# Patient Record
Sex: Female | Born: 1960 | Race: White | Hispanic: No | Marital: Married | Smoking: Never smoker
Health system: Southern US, Community
[De-identification: ages and names within clinical notes are randomized; demographics above are authoritative.]

---

## 2013-02-18 DIAGNOSIS — Z0000 Encounter for general adult medical examination without abnormal findings: Secondary | ICD-10-CM | POA: Insufficient documentation

## 2019-01-21 DIAGNOSIS — M8580 Other specified disorders of bone density and structure, unspecified site: Secondary | ICD-10-CM | POA: Insufficient documentation

## 2019-11-10 DIAGNOSIS — U071 COVID-19: Secondary | ICD-10-CM | POA: Insufficient documentation

## 2019-12-03 ENCOUNTER — Emergency Department
Admission: EM | Admit: 2019-12-03 | Discharge: 2019-12-03 | Disposition: A | Discharge status: Home / Self Care | Payer: BC Managed Care – PPO | Source: Home / Self Care

## 2019-12-03 ENCOUNTER — Other Ambulatory Visit: Payer: Self-pay

## 2019-12-03 ENCOUNTER — Emergency Department (INDEPENDENT_AMBULATORY_CARE_PROVIDER_SITE_OTHER): Payer: BC Managed Care – PPO

## 2019-12-03 DIAGNOSIS — R053 Chronic cough: Secondary | ICD-10-CM

## 2019-12-03 DIAGNOSIS — J040 Acute laryngitis: Secondary | ICD-10-CM

## 2019-12-03 DIAGNOSIS — Z8616 Personal history of COVID-19: Secondary | ICD-10-CM

## 2019-12-03 DIAGNOSIS — J069 Acute upper respiratory infection, unspecified: Secondary | ICD-10-CM

## 2019-12-03 MED ORDER — GUAIFENESIN ER 600 MG PO TB12: 600.0000 mg | ORAL_TABLET | Freq: Two times a day (BID) | ORAL | 0 refills | Status: DC | PRN

## 2019-12-03 MED ORDER — CEPACOL EXTRA STRENGTH 15-2.6 MG MT LOZG: 1.0000 | LOZENGE | Freq: Four times a day (QID) | OROMUCOSAL | 0 refills | Status: AC | PRN

## 2019-12-03 NOTE — ED Triage Notes (Signed)
Pt saw PCP on 9/15 for bronchitis also had covid test sent out that same day. Pt was given tessalon, prednisone, and albuterol inhaler. PCP called her 3 days later d/t + COVID test. Pt since then has been having cough and sore throat symptoms. Of note, she is not vaccinated. No otc at this time.

## 2019-12-03 NOTE — Discharge Instructions (Signed)
°  Try the medications prescribed to help with your symptoms. Be sure to stay well hydrated and get at least 8 hours of sleep at night, more when feeling ill.  Follow up with family medicine next week if not improving, especially if symptoms worsening.

## 2019-12-03 NOTE — ED Provider Notes (Signed)
[facility]    CSN: [id number] Arrival date & time: 12/03/19  1003      History   Chief Complaint Chief Complaint  Patient presents with  . Cough  . Nasal Congestion  . Sore Throat    HPI Dana Mccarthy is a 59 y.o. female.   HPI Dana Mccarthy is a 59 y.o. female presenting to UC with c/o continued cough since being dx with COVID last month.  She was started on tessalon, prednisone and given an albuterol inhaler with moderate relief.  She has continued to cough and last night developed a sore throat and hoarse voice.  Denies chest pain or SOB.  Low-grade fever 99.3*F.     History reviewed. No pertinent past medical history.  There are no problems to display for this patient.   History reviewed. No pertinent surgical history.  OB History   No obstetric history on file.      Home Medications    Prior to Admission medications   Medication Sig Start Date End Date Taking? Authorizing Provider  albuterol (VENTOLIN HFA) 108 (90 Base) MCG/ACT inhaler Inhale into the lungs. 11/04/19  Yes [provider]  famotidine (PEPCID) 20 MG tablet Take by mouth. 09/21/19  Yes [provider]  ascorbic acid (VITAMIN C) 100 MG tablet Take by mouth.    [provider]  Benzocaine-Menthol (CEPACOL EXTRA STRENGTH) 15-2.6 MG LOZG Use as directed 1 lozenge in the mouth or throat 4 (four) times daily as needed. 12/03/19   Lurene Shadow, PA-C  cetirizine (ZYRTEC) 10 MG tablet Take by mouth.    [provider]  cyanocobalamin 100 MCG tablet Vitamin B-12    [provider]  guaiFENesin (MUCINEX) 600 MG 12 hr tablet Take 1 tablet (600 mg total) by mouth 2 (two) times daily as needed for cough or to loosen phlegm. Take with large glass of water 12/03/19   Lurene Shadow, PA-C  Omega-3 Fatty Acids (FISH OIL) 1000 MG CAPS Take 1 capsule by mouth daily.    [provider]  zinc gluconate 50 MG tablet Take by mouth.    [provider]    Family History Family History  Problem Relation Age of Onset  . Cancer Mother     Social History Social History   Tobacco Use  . Smoking status: Never Smoker  . Smokeless tobacco: Never Used  Vaping Use  . Vaping Use: Never used  Substance Use Topics  . Alcohol use: Not on file  . Drug use: Not on file     Allergies   Codeine   Review of Systems Review of Systems  Constitutional: Negative for chills and fever.  HENT: Positive for congestion, sore throat and voice change. Negative for ear pain and trouble swallowing.   Respiratory: Positive for cough. Negative for shortness of breath.   Cardiovascular: Negative for chest pain and palpitations.  Gastrointestinal: Negative for abdominal pain, diarrhea, nausea and vomiting.  Musculoskeletal: Negative for arthralgias, back pain and myalgias.  Skin: Negative for rash.  All other systems reviewed and are negative.    Physical Exam Triage Vital Signs ED Triage Vitals  Enc Vitals Group     BP 12/03/19 1032 107/73     Pulse Rate 12/03/19 1032 92     Resp 12/03/19 1032 18     Temp 12/03/19 1032 99.3 F (37.4 C)     Temp Source 12/03/19 1032 Oral     SpO2 12/03/19 1032 97 %  Weight 12/03/19 1026 150 lb (68 kg)     Height 12/03/19 1026 5\' 3"  (1.6 m)     Head Circumference --      Peak Flow --      Pain Score 12/03/19 1026 0     Pain Loc --      Pain Edu? --      Excl. in GC? --    No data found.  Updated Vital Signs BP 107/73 (BP Location: Left Arm)   Pulse 92   Temp 99.3 F (37.4 C) (Oral)   Resp 18   Ht 5\' 3"  (1.6 m)   Wt 150 lb (68 kg)   SpO2 97%   BMI 26.57 kg/m   Visual Acuity Right Eye Distance:   Left Eye Distance:   Bilateral Distance:    Right Eye Near:   Left Eye Near:    Bilateral Near:     Physical Exam Vitals and nursing note reviewed.  Constitutional:      General: She is not in acute distress.    Appearance: She is well-developed. She is not ill-appearing,  toxic-appearing or diaphoretic.  HENT:     Head: Normocephalic and atraumatic.     Right Ear: Tympanic membrane and ear canal normal.     Left Ear: Tympanic membrane and ear canal normal.     Nose: Nose normal.     Right Sinus: No maxillary sinus tenderness or frontal sinus tenderness.     Left Sinus: No maxillary sinus tenderness or frontal sinus tenderness.     Mouth/Throat:     Lips: Pink.     Mouth: Mucous membranes are moist.     Pharynx: Oropharynx is clear. Uvula midline. No pharyngeal swelling, oropharyngeal exudate, posterior oropharyngeal erythema or uvula swelling.  Cardiovascular:     Rate and Rhythm: Normal rate and regular rhythm.  Pulmonary:     Effort: Pulmonary effort is normal. No respiratory distress.     Breath sounds: Normal breath sounds. No stridor. No wheezing, rhonchi or rales.     Comments: Mildly hoarse voice, no stridor Musculoskeletal:        General: Normal range of motion.     Cervical back: Normal range of motion and neck supple.  Lymphadenopathy:     Cervical: No cervical adenopathy.  Skin:    General: Skin is warm and dry.  Neurological:     Mental Status: She is alert and oriented to person, place, and time.  Psychiatric:        Behavior: Behavior normal.      UC Treatments / Results  Labs (all labs ordered are listed, but only abnormal results are displayed) Labs Reviewed - No data to display  EKG   Radiology DG Chest 2 View  Result Date: 12/03/2019 CLINICAL DATA:  Cough and chest tightness. Diagnosed with COVID-19 1 month ago. EXAM: CHEST - 2 VIEW COMPARISON:  None. FINDINGS: The heart size and mediastinal contours are within normal limits. Both lungs are clear. The visualized skeletal structures are unremarkable. IMPRESSION: No active cardiopulmonary disease. Electronically Signed   By: M.D.   On: 12/03/2019 10:57    Procedures Procedures (including critical care time)  Medications Ordered in UC Medications - No  data to display  Initial Impression / Assessment and Plan / UC Course  I have reviewed the triage vital signs and the nursing notes.  Pertinent labs & imaging results that were available during my care of the patient were reviewed by me  and considered in my medical decision making (see chart for details).    Vitals: Temp 99.3*F, otherwise WNL including O2 Sat 97% on RA Reassured pt of normal CXR Hx and exam c/w viral laryngitis Encouraged f/u with PCP AVS given  Final Clinical Impressions(s) / UC Diagnoses   Final diagnoses:  Viral URI with cough  Laryngitis     Discharge Instructions      Try the medications prescribed to help with your symptoms. Be sure to stay well hydrated and get at least 8 hours of sleep at night, more when feeling ill.  Follow up with family medicine next week if not improving, especially if symptoms worsening.    ED Prescriptions    Medication Sig Dispense Auth. Provider   guaiFENesin (MUCINEX) 600 MG 12 hr tablet Take 1 tablet (600 mg total) by mouth 2 (two) times daily as needed for cough or to loosen phlegm. Take with large glass of water 20 tablet Giordano Getman O, PA-C   Benzocaine-Menthol (CEPACOL EXTRA STRENGTH) 15-2.6 MG LOZG Use as directed 1 lozenge in the mouth or throat 4 (four) times daily as needed. 16 lozenge Lurene Shadow, [place]     PDMP not reviewed this encounter.   Lurene Shadow, [place] 12/03/19 1317

## 2019-12-24 ENCOUNTER — Emergency Department: Admit: 2019-12-24 | Discharge status: Absent without leave | Payer: Self-pay

## 2019-12-24 ENCOUNTER — Emergency Department
Admission: EM | Admit: 2019-12-24 | Discharge: 2019-12-24 | Disposition: A | Discharge status: Home / Self Care | Payer: BC Managed Care – PPO | Source: Home / Self Care

## 2019-12-24 ENCOUNTER — Other Ambulatory Visit: Payer: Self-pay

## 2019-12-24 ENCOUNTER — Encounter: Payer: Self-pay | Admitting: Emergency Medicine

## 2019-12-24 DIAGNOSIS — J029 Acute pharyngitis, unspecified: Secondary | ICD-10-CM

## 2019-12-24 DIAGNOSIS — J039 Acute tonsillitis, unspecified: Secondary | ICD-10-CM

## 2019-12-24 DIAGNOSIS — R053 Chronic cough: Secondary | ICD-10-CM

## 2019-12-24 DIAGNOSIS — U099 Post covid-19 condition, unspecified: Secondary | ICD-10-CM

## 2019-12-24 LAB — POCT RAPID STREP A (OFFICE): Rapid Strep A Screen: NEGATIVE

## 2019-12-24 NOTE — ED Triage Notes (Addendum)
Dx w/ covid on 9/15 -has had ongoing symptoms since then - cough, eyeball pressure & now has a sore throat - hurts to swallow Pt stated she noticed small blisters in the back of her throat No OTC meds except cepacol Pt wants to rule out strep Denies fever -temp has been in 99 every day since COVID dx Rec'd infusion on 11/11/19- no COVID vaccine  Pt has not been to a post COVID clinic for follow up  PCP is w/ Smitty Cords

## 2019-12-24 NOTE — Discharge Instructions (Signed)
  It is recommended you call to schedule an appointment with the [facility] for further evaluation and treatment of ongoing symptoms since your COVID diagnosis 2 months ago.  Labs, x-ray and medications are available at the center.  The provider is also able to refer you to a specialist if needed including cardiology, pulmonology, ear nose and throat, or even physical therapy if indicated.

## 2019-12-24 NOTE — ED Provider Notes (Signed)
[facility]    CSN: [id number] Arrival date & time: 12/24/19  1245      History   Chief Complaint Chief Complaint  Patient presents with  . Cough    HPI Dana Mccarthy is a 59 y.o. female.   HPI Dana Mccarthy is a 59 y.o. female presenting to UC with c/o continued dry cough since being dx with COVID on 11/04/19. Associated eye pressure and intermittent sore throat.  Pt was seen at [facility] on 12/03/19, dx with laryngitis, had a normal CXR.  She was seen at a different UC on 12/07/19, tx with 7 day course of Augmentin for sinusitis.  Sinus pressure went away as well as throat pain. She completed a video visit with her PCP on 12/08/19, was advised to call back if symptoms persisted. Pt states she called and was offered another virtual visit.  Pt c/o 2 days of worsening throat pain and now white patches. Pt concerned for strep throat. No known exposure to strep. Persistent low grade fever of 99*F since COVID infection. Denies n/v/d. No chest pain or SOB. No HA or dizziness.      History reviewed. No pertinent past medical history.  Patient Active Problem List   Diagnosis Date Noted  . COVID-19 11/10/2019  . Osteopenia 01/21/2019  . Routine health maintenance 02/18/2013    History reviewed. No pertinent surgical history.  OB History   No obstetric history on file.      Home Medications    Prior to Admission medications   Medication Sig Start Date End Date Taking? Authorizing Provider  albuterol (VENTOLIN HFA) 108 (90 Base) MCG/ACT inhaler Inhale into the lungs. 11/04/19  Yes [provider]  APPLE CIDER VINEGAR PO Take by mouth.   Yes [provider]  ascorbic acid (VITAMIN C) 100 MG tablet Take by mouth.   Yes [provider]  Benzocaine-Menthol (CEPACOL EXTRA STRENGTH) 15-2.6 MG LOZG Use as directed 1 lozenge in the mouth or throat 4 (four) times daily as needed. 12/03/19  Yes Miyanna Wiersma O, PA-C  cetirizine (ZYRTEC) 10 MG tablet Take  by mouth.   Yes [provider]  cyanocobalamin 100 MCG tablet Vitamin B-12   Yes [provider]  famotidine (PEPCID) 20 MG tablet Take by mouth. 09/21/19  Yes [provider]  Omega-3 Fatty Acids (FISH OIL) 1000 MG CAPS Take 1 capsule by mouth daily.   Yes [provider]  zinc gluconate 50 MG tablet Take by mouth.   Yes [provider]  Cholecalciferol 10 MCG (400 UNIT) CHEW Chew by mouth.    [provider]  guaiFENesin (MUCINEX) 600 MG 12 hr tablet Take 1 tablet (600 mg total) by mouth 2 (two) times daily as needed for cough or to loosen phlegm. Take with large glass of water 12/03/19   Lurene Shadow, PA-C    Family History Family History  Problem Relation Age of Onset  . Cancer Mother     Social History Social History   Tobacco Use  . Smoking status: Never Smoker  . Smokeless tobacco: Never Used  Vaping Use  . Vaping Use: Never used  Substance Use Topics  . Alcohol use: Not Currently  . Drug use: Never     Allergies   Codeine   Review of Systems Review of Systems  Constitutional: Negative for chills and fever.  HENT: Positive for sore throat. Negative for congestion, ear pain, trouble swallowing and voice change.   Respiratory: Positive for cough.  Negative for shortness of breath.   Cardiovascular: Negative for chest pain and palpitations.  Gastrointestinal: Negative for abdominal pain, diarrhea, nausea and vomiting.  Musculoskeletal: Negative for arthralgias, back pain and myalgias.  Skin: Negative for rash.  Neurological: Negative for dizziness, light-headedness and headaches.  All other systems reviewed and are negative.    Physical Exam Triage Vital Signs ED Triage Vitals  Enc Vitals Group     BP 12/24/19 1302 124/80     Pulse Rate 12/24/19 1302 81     Resp 12/24/19 1302 16     Temp 12/24/19 1302 98.3 F (36.8 C)     Temp Source 12/24/19 1302 Oral     SpO2 12/24/19 1302 100 %     Weight 12/24/19  1302 147 lb (66.7 kg)     Height 12/24/19 1302 5\' 3"  (1.6 m)     Head Circumference --      Peak Flow --      Pain Score 12/24/19 1309 2     Pain Loc --      Pain Edu? --      Excl. in GC? --    No data found.  Updated Vital Signs BP 124/80 (BP Location: Right Arm)   Pulse 81   Temp 98.3 F (36.8 C) (Oral)   Resp 16   Ht 5\' 3"  (1.6 m)   Wt 147 lb (66.7 kg)   SpO2 100%   BMI 26.04 kg/m   Visual Acuity Right Eye Distance:   Left Eye Distance:   Bilateral Distance:    Right Eye Near:   Left Eye Near:    Bilateral Near:     Physical Exam Vitals and nursing note reviewed.  Constitutional:      General: She is not in acute distress.    Appearance: Normal appearance. She is well-developed. She is not ill-appearing, toxic-appearing or diaphoretic.  HENT:     Head: Normocephalic and atraumatic.     Right Ear: Tympanic membrane and ear canal normal.     Left Ear: Tympanic membrane and ear canal normal.     Nose: Nose normal.     Mouth/Throat:     Lips: Pink.     Mouth: Mucous membranes are moist.     Pharynx: Uvula midline. Posterior oropharyngeal erythema present. No pharyngeal swelling or uvula swelling.     Tonsils: Tonsillar exudate present. No tonsillar abscesses.  Cardiovascular:     Rate and Rhythm: Normal rate and regular rhythm.  Pulmonary:     Effort: Pulmonary effort is normal. No respiratory distress.     Breath sounds: Normal breath sounds. No stridor. No wheezing, rhonchi or rales.  Musculoskeletal:        General: Normal range of motion.     Cervical back: Normal range of motion and neck supple. No tenderness.  Lymphadenopathy:     Cervical: No cervical adenopathy.  Skin:    General: Skin is warm and dry.  Neurological:     Mental Status: She is alert and oriented to person, place, and time.  Psychiatric:        Behavior: Behavior normal.      UC Treatments / Results  Labs (all labs ordered are listed, but only abnormal results are  displayed) Labs Reviewed  STREP A DNA PROBE  POCT RAPID STREP A (OFFICE)    EKG   Radiology No results found.  Procedures Procedures (including critical care time)  Medications Ordered in UC Medications - No data to display  Initial Impression / Assessment and Plan / UC Course  I have reviewed the triage vital signs and the nursing notes.  Pertinent labs & imaging results that were available during my care of the patient were reviewed by me and considered in my medical decision making (see chart for details).     Rapid strep: NEGATIVE Culture sent Encouraged to f/u with [facility] for further evaluation and treatment of symptoms related to COVID infection from September 2021. No evidence of bacterial infection at this time, lungs: CTAB vitals: WNL AVS given  Final Clinical Impressions(s) / UC Diagnoses   Final diagnoses:  Exudative tonsillitis  Acute pharyngitis, unspecified etiology  Chronic cough  COVID-19 long hauler     Discharge Instructions      It is recommended you call to schedule an appointment with the [facility] for further evaluation and treatment of ongoing symptoms since your COVID diagnosis 2 months ago.  Labs, x-ray and medications are available at the center.  The provider is also able to refer you to a specialist if needed including cardiology, pulmonology, ear nose and throat, or even physical therapy if indicated.     ED Prescriptions    None     PDMP not reviewed this encounter.   Lurene Shadow, PA-C 12/24/19 1350

## 2019-12-25 LAB — STREP A DNA PROBE: Group A Strep Probe: NOT DETECTED

## 2020-04-14 ENCOUNTER — Emergency Department
Admission: EM | Admit: 2020-04-14 | Discharge: 2020-04-14 | Disposition: A | Discharge status: Home / Self Care | Payer: BC Managed Care – PPO | Source: Home / Self Care | Attending: Family Medicine | Admitting: Family Medicine

## 2020-04-14 ENCOUNTER — Encounter: Payer: Self-pay | Admitting: Emergency Medicine

## 2020-04-14 ENCOUNTER — Other Ambulatory Visit: Payer: Self-pay

## 2020-04-14 DIAGNOSIS — J069 Acute upper respiratory infection, unspecified: Secondary | ICD-10-CM

## 2020-04-14 MED ORDER — PREDNISONE 20 MG PO TABS: ORAL_TABLET | ORAL | 0 refills | Status: AC

## 2020-04-14 NOTE — Discharge Instructions (Addendum)
Take plain guaifenesin (1200mg  extended release tabs such as Mucinex) twice daily, with plenty of water, for cough and congestion.  May add Pseudoephedrine (30mg , one or two every 4 to 6 hours) for sinus congestion.  Get adequate rest.   May take Delsym Cough Suppressant ("12 Hour Cough Relief") at bedtime for nighttime cough.  Try warm salt water gargles for sore throat.  Stop all antihistamines for now, and other non-prescription cough/cold preparations.  If symptoms become significantly worse during the night or over the weekend, proceed to the local emergency room.

## 2020-04-14 NOTE — ED Triage Notes (Signed)
Cough since Monday  Denies fever  Increased snoring recently Gets bronchitis this time every year No meds OTC - does not like to take meds- takes vitamins since she had COVID in September No COVID vaccines

## 2020-04-14 NOTE — ED Provider Notes (Signed)
[facility]    CSN: [id number] Arrival date & time: 04/14/20  1143      History   Chief Complaint Chief Complaint  Patient presents with  . Cough    HPI Dana Mccarthy is a 60 y.o. female.   About 4 days ago patient began developing a scratchy throat, mild sinus congestion, and non-productive cough.  She denies pleuritic pain, shortness of breath, and fever but believes that she has wheezed occasionally.  She had COVID19 infection five months ago. She states that she has a history of recurring bronchitis this time every year.  The history is provided by the patient.    History reviewed. No pertinent past medical history.  Patient Active Problem List   Diagnosis Date Noted  . COVID-19 11/10/2019  . Osteopenia 01/21/2019  . Routine health maintenance 02/18/2013    History reviewed. No pertinent surgical history.  OB History   No obstetric history on file.      Home Medications    Prior to Admission medications   Medication Sig Start Date End Date Taking? Authorizing Provider  ascorbic acid (VITAMIN C) 100 MG tablet Take by mouth.   Yes [provider]  cetirizine (ZYRTEC) 10 MG tablet Take by mouth.   Yes [provider]  Cholecalciferol 10 MCG (400 UNIT) CHEW Chew by mouth.   Yes [provider]  cyanocobalamin 100 MCG tablet Vitamin B-12   Yes [provider]  famotidine (PEPCID) 20 MG tablet Take by mouth. 09/21/19  Yes [provider]  Omega-3 Fatty Acids (FISH OIL) 1000 MG CAPS Take 1 capsule by mouth daily.   Yes [provider]  predniSONE (DELTASONE) 20 MG tablet Take one tab by mouth twice daily for 4 days, then one daily for 3 days. Take with food. 04/14/20  Yes Lattie Haw, MD  albuterol (VENTOLIN HFA) 108 (90 Base) MCG/ACT inhaler Inhale into the lungs. Patient not taking: Reported on 04/14/2020 11/04/19   [provider]  APPLE CIDER VINEGAR PO Take by mouth. Patient not  taking: Reported on 04/14/2020    [provider]  Benzocaine-Menthol (CEPACOL EXTRA STRENGTH) 15-2.6 MG LOZG Use as directed 1 lozenge in the mouth or throat 4 (four) times daily as needed. Patient not taking: Reported on 04/14/2020 12/03/19   Lurene Shadow, PA-C  zinc gluconate 50 MG tablet Take by mouth. Patient not taking: Reported on 04/14/2020    [provider]    Family History Family History  Problem Relation Age of Onset  . Cancer Mother     Social History Social History   Tobacco Use  . Smoking status: Never Smoker  . Smokeless tobacco: Never Used  Vaping Use  . Vaping Use: Never used  Substance Use Topics  . Alcohol use: Not Currently  . Drug use: Never     Allergies   Codeine   Review of Systems Review of Systems  + sore throat + cough No pleuritic pain ? wheezing + nasal congestion + post-nasal drainage No sinus pain/pressure No itchy/red eyes No earache No hemoptysis No SOB No fever/chills No nausea No vomiting No abdominal pain No diarrhea No urinary symptoms No skin rash No fatigue No myalgias No headache    Physical Exam Triage Vital Signs ED Triage Vitals  Enc Vitals Group     BP 04/14/20 1155 121/81     Pulse Rate 04/14/20 1155 98     Resp 04/14/20 1155 16     Temp 04/14/20  1155 99.4 F (37.4 C)     Temp Source 04/14/20 1155 Oral     SpO2 04/14/20 1155 99 %     Weight 04/14/20 1201 146 lb (66.2 kg)     Height 04/14/20 1201 5\' 3"  (1.6 m)     Head Circumference --      Peak Flow --      Pain Score 04/14/20 1201 0     Pain Loc --      Pain Edu? --      Excl. in GC? --    No data found.  Updated Vital Signs BP 121/81 (BP Location: Right Arm)   Pulse 98   Temp 99.4 F (37.4 C) (Oral)   Resp 16   Ht 5\' 3"  (1.6 m)   Wt 66.2 kg   SpO2 99%   BMI 25.86 kg/m   Visual Acuity Right Eye Distance:   Left Eye Distance:   Bilateral Distance:    Right Eye Near:   Left Eye Near:    Bilateral Near:      Physical Exam Nursing notes and Vital Signs reviewed. Appearance:  Patient appears stated age, and in no acute distress Eyes:  Pupils are equal, round, and reactive to light and accomodation.  Extraocular movement is intact.  Conjunctivae are not inflamed  Ears:  Canals normal.  Tympanic membranes normal.  Nose:  Mildly congested turbinates.  No sinus tenderness.  Pharynx:  Normal Neck:  Supple.  Mildly enlarged lateral nodes are present, tender to palpation on the left.   Lungs:  Clear to auscultation.  Breath sounds are equal.  Moving air well. Heart:  Regular rate and rhythm without murmurs, rubs, or gallops.  Abdomen:  Nontender without masses or hepatosplenomegaly.  Bowel sounds are present.  No CVA or flank tenderness.  Extremities:  No edema.  Skin:  No rash present.   UC Treatments / Results  Labs (all labs ordered are listed, but only abnormal results are displayed) Labs Reviewed - No data to display  EKG   Radiology No results found.  Procedures Procedures (including critical care time)  Medications Ordered in UC Medications - No data to display  Initial Impression / Assessment and Plan / UC Course  I have reviewed the triage vital signs and the nursing notes.  Pertinent labs & imaging results that were available during my care of the patient were reviewed by me and considered in my medical decision making (see chart for details).    Benign exam.  There is no evidence of bacterial infection today.   Begin prednisone burst/taper. Followup with Family Doctor if not improved in about 10 days.   Final Clinical Impressions(s) / UC Diagnoses   Final diagnoses:  Viral URI with cough     Discharge Instructions     Take plain guaifenesin (1200mg  extended release tabs such as Mucinex) twice daily, with plenty of water, for cough and congestion.  May add Pseudoephedrine (30mg , one or two every 4 to 6 hours) for sinus congestion.  Get adequate rest.   May take  Delsym Cough Suppressant ("12 Hour Cough Relief") at bedtime for nighttime cough.  Try warm salt water gargles for sore throat.  Stop all antihistamines for now, and other non-prescription cough/cold preparations.  If symptoms become significantly worse during the night or over the weekend, proceed to the local emergency room.       ED Prescriptions    Medication Sig Dispense Auth. Provider   predniSONE (DELTASONE) 20 MG  tablet Take one tab by mouth twice daily for 4 days, then one daily for 3 days. Take with food. 11 tablet Lattie Haw, MD        Lattie Haw, MD 04/16/20 1250

## 2021-01-11 ENCOUNTER — Other Ambulatory Visit: Payer: Self-pay

## 2021-01-11 ENCOUNTER — Emergency Department
Admission: EM | Admit: 2021-01-11 | Discharge: 2021-01-11 | Disposition: A | Discharge status: Home / Self Care | Payer: BC Managed Care – PPO | Source: Home / Self Care

## 2021-01-11 ENCOUNTER — Encounter: Payer: Self-pay | Admitting: Emergency Medicine

## 2021-01-11 DIAGNOSIS — R52 Pain, unspecified: Secondary | ICD-10-CM

## 2021-01-11 DIAGNOSIS — R059 Cough, unspecified: Secondary | ICD-10-CM

## 2021-01-11 DIAGNOSIS — J309 Allergic rhinitis, unspecified: Secondary | ICD-10-CM

## 2021-01-11 MED ORDER — BENZONATATE 200 MG PO CAPS: 200.0000 mg | ORAL_CAPSULE | Freq: Three times a day (TID) | ORAL | 0 refills | Status: AC | PRN

## 2021-01-11 MED ORDER — FEXOFENADINE HCL 180 MG PO TABS: 180.0000 mg | ORAL_TABLET | Freq: Every day | ORAL | 0 refills | Status: AC

## 2021-01-11 NOTE — Discharge Instructions (Addendum)
Advised patient to continue daily Zyrtec and take Allegra daily for the next 4 to 5 days for concurrent postnasal drainage/drip may use as needed afterwards.  Advised patient may take Tessalon Perles daily or as needed for cough.  Advised patient we will follow-up with COVID-19 flu AB results once received.

## 2021-01-11 NOTE — ED Triage Notes (Addendum)
Patient presents to Urgent Care with complaints of sore throat, congestion since 2 days ago. Patient reports nasal congestion, body aches and cough. Has not tried anything for symptoms.

## 2021-01-11 NOTE — ED Provider Notes (Signed)
[facility]    CSN: [id number] Arrival date & time: 01/11/21  0941      History   Chief Complaint Chief Complaint  Patient presents with   Nasal Congestion   Sore Throat    HPI Dana Mccarthy is a 60 y.o. female.   HPI 60 year old female presents with sore throat, nasal congestion for 2 days.  Patient also reports myalgias and cough x 2 days.  Patient denies fever and is currently 99.1 without taking either OTC Tylenol or Ibuprofen.  History reviewed. No pertinent past medical history.  Patient Active Problem List   Diagnosis Date Noted   COVID-19 11/10/2019   Osteopenia 01/21/2019   Routine health maintenance 02/18/2013    History reviewed. No pertinent surgical history.  OB History   No obstetric history on file.      Home Medications    Prior to Admission medications   Medication Sig Start Date End Date Taking? Authorizing Provider  ascorbic acid (VITAMIN C) 100 MG tablet Take by mouth.   Yes [provider]  atorvastatin (LIPITOR) 20 MG tablet atorvastatin 20 mg tablet   Yes [provider]  benzonatate (TESSALON) 200 MG capsule Take 1 capsule (200 mg total) by mouth 3 (three) times daily as needed for cough. 01/11/21  Yes Trevor Iha, FNP  Cholecalciferol 10 MCG (400 UNIT) CHEW Chew by mouth.   Yes [provider]  cyanocobalamin 100 MCG tablet Vitamin B-12   Yes [provider]  famotidine (PEPCID) 20 MG tablet Take by mouth. 09/21/19  Yes [provider]  fexofenadine (ALLEGRA ALLERGY) 180 MG tablet Take 1 tablet (180 mg total) by mouth daily for 15 days. 01/11/21 01/26/21 Yes Trevor Iha, FNP  Omega-3 Fatty Acids (FISH OIL) 1000 MG CAPS Take 1 capsule by mouth daily.   Yes [provider]  albuterol (VENTOLIN HFA) 108 (90 Base) MCG/ACT inhaler Inhale into the lungs. Patient not taking: Reported on 04/14/2020 11/04/19   [provider]  APPLE CIDER VINEGAR PO Take by  mouth. Patient not taking: Reported on 04/14/2020    [provider]  Benzocaine-Menthol (CEPACOL EXTRA STRENGTH) 15-2.6 MG LOZG Use as directed 1 lozenge in the mouth or throat 4 (four) times daily as needed. Patient not taking: Reported on 04/14/2020 12/03/19   Lurene Shadow, PA-C  cetirizine (ZYRTEC) 10 MG tablet Take by mouth.    [provider]  maraviroc (SELZENTRY) 300 MG tablet Selzentry 300 mg tablet  TAKE ONE (1) TABLET BY MOUTH TWICE DAILY    [provider]  predniSONE (DELTASONE) 20 MG tablet Take one tab by mouth twice daily for 4 days, then one daily for 3 days. Take with food. 04/14/20   Lattie Haw, MD  zinc gluconate 50 MG tablet Take by mouth. Patient not taking: Reported on 04/14/2020    [provider]    Family History Family History  Problem Relation Age of Onset   Cancer Mother     Social History Social History   Tobacco Use   Smoking status: Never   Smokeless tobacco: Never  Vaping Use   Vaping Use: Never used  Substance Use Topics   Alcohol use: Not Currently   Drug use: Never     Allergies   Codeine   Review of Systems Review of Systems  HENT:  Positive for congestion and sore throat.   Respiratory:  Positive for cough.   Musculoskeletal:  Positive for myalgias.  All other systems reviewed and  are negative.   Physical Exam Triage Vital Signs ED Triage Vitals  Enc Vitals Group     BP 01/11/21 1041 123/75     Pulse Rate 01/11/21 1041 (!) 101     Resp 01/11/21 1041 16     Temp 01/11/21 1041 99.1 F (37.3 C)     Temp Source 01/11/21 1041 Oral     SpO2 01/11/21 1041 98 %     Weight --      Height --      Head Circumference --      Peak Flow --      Pain Score 01/11/21 1037 5     Pain Loc --      Pain Edu? --      Excl. in GC? --    No data found.  Updated Vital Signs BP 123/75 (BP Location: Right Arm)   Pulse (!) 101   Temp 99.1 F (37.3 C) (Oral)   Resp 16   SpO2 98%     Physical  Exam Vitals and nursing note reviewed.  Constitutional:      Appearance: She is well-developed and normal weight.  HENT:     Head: Normocephalic and atraumatic.     Right Ear: Tympanic membrane and ear canal normal.     Left Ear: Tympanic membrane and ear canal normal.     Mouth/Throat:     Mouth: Mucous membranes are moist.     Pharynx: Oropharynx is clear. Uvula midline. No posterior oropharyngeal erythema or uvula swelling.     Comments: Moderate amount of clear drainage of posterior oropharynx noted Eyes:     Conjunctiva/sclera: Conjunctivae normal.     Pupils: Pupils are equal, round, and reactive to light.  Cardiovascular:     Rate and Rhythm: Normal rate and regular rhythm.     Heart sounds: Normal heart sounds.  Pulmonary:     Effort: Pulmonary effort is normal.     Breath sounds: Normal breath sounds.  Musculoskeletal:     Cervical back: Normal range of motion and neck supple.  Neurological:     General: No focal deficit present.     Mental Status: She is alert and oriented to person, place, and time.     UC Treatments / Results  Labs (all labs ordered are listed, but only abnormal results are displayed) Labs Reviewed - No data to display  EKG   Radiology No results found.  Procedures Procedures (including critical care time)  Medications Ordered in UC Medications - No data to display  Initial Impression / Assessment and Plan / UC Course  I have reviewed the triage vital signs and the nursing notes.  Pertinent labs & imaging results that were available during my care of the patient were reviewed by me and considered in my medical decision making (see chart for details).    MDM: 1.  Generalized body aches-COVID-19, flu A/B ordered; 2.  Allergic rhinitis-Next Allegra; 3.  Cough-Rx'd Tessalon Perles. Advised patient to take Allegra daily for the next 4 to 5 days for concurrent postnasal drainage/drip may use as needed afterwards.  Advised patient may take  Tessalon Perles daily or as needed for cough.  Advised patient we will follow-up with COVID-19 flu AB results once received.  Patient discharged home, hemodynamically stable.  Final Clinical Impressions(s) / UC Diagnoses   Final diagnoses:  Generalized body aches  Cough, unspecified type  Allergic rhinitis, unspecified seasonality, unspecified trigger     Discharge Instructions  Advised patient to continue daily Zyrtec and take Allegra daily for the next 4 to 5 days for concurrent postnasal drainage/drip may use as needed afterwards.  Advised patient may take Tessalon Perles daily or as needed for cough.  Advised patient we will follow-up with COVID-19 flu AB results once received.     ED Prescriptions     Medication Sig Dispense Auth. Provider   fexofenadine (ALLEGRA ALLERGY) 180 MG tablet Take 1 tablet (180 mg total) by mouth daily for 15 days. 15 tablet Trevor Iha, FNP   benzonatate (TESSALON) 200 MG capsule Take 1 capsule (200 mg total) by mouth 3 (three) times daily as needed for cough. 40 capsule Trevor Iha, FNP      PDMP not reviewed this encounter.   Trevor Iha, FNP 01/11/21 1115

## 2021-01-13 LAB — COVID-19, FLU A+B NAA
Influenza A, NAA: NOT DETECTED
Influenza B, NAA: NOT DETECTED
SARS-CoV-2, NAA: NOT DETECTED

## 2021-04-19 DIAGNOSIS — Z1231 Encounter for screening mammogram for malignant neoplasm of breast: Secondary | ICD-10-CM | POA: Diagnosis not present

## 2021-04-20 ENCOUNTER — Telehealth (HOSPITAL_BASED_OUTPATIENT_CLINIC_OR_DEPARTMENT_OTHER): Payer: Self-pay

## 2021-04-20 ENCOUNTER — Other Ambulatory Visit (HOSPITAL_BASED_OUTPATIENT_CLINIC_OR_DEPARTMENT_OTHER): Payer: Self-pay | Admitting: Emergency Medicine

## 2021-04-20 DIAGNOSIS — R413 Other amnesia: Secondary | ICD-10-CM

## 2021-05-10 ENCOUNTER — Ambulatory Visit (HOSPITAL_BASED_OUTPATIENT_CLINIC_OR_DEPARTMENT_OTHER)
Admission: RE | Admit: 2021-05-10 | Discharge: 2021-05-10 | Disposition: A | Discharge status: Home / Self Care | Payer: BC Managed Care – PPO | Source: Ambulatory Visit | Attending: Emergency Medicine | Admitting: Emergency Medicine

## 2021-05-10 ENCOUNTER — Other Ambulatory Visit: Payer: Self-pay

## 2021-05-10 DIAGNOSIS — R413 Other amnesia: Secondary | ICD-10-CM | POA: Insufficient documentation

## 2021-05-10 MED ORDER — GADOBUTROL 1 MMOL/ML IV SOLN
7.0000 mL | Freq: Once | INTRAVENOUS | Status: AC | PRN
  Administered 2021-05-10: 7 mL via INTRAVENOUS

## 2021-06-30 DIAGNOSIS — Z6823 Body mass index (BMI) 23.0-23.9, adult: Secondary | ICD-10-CM | POA: Diagnosis not present

## 2021-06-30 DIAGNOSIS — Z01419 Encounter for gynecological examination (general) (routine) without abnormal findings: Secondary | ICD-10-CM | POA: Diagnosis not present

## 2021-07-05 DIAGNOSIS — R42 Dizziness and giddiness: Secondary | ICD-10-CM | POA: Diagnosis not present

## 2021-07-05 DIAGNOSIS — K449 Diaphragmatic hernia without obstruction or gangrene: Secondary | ICD-10-CM | POA: Diagnosis not present

## 2021-07-05 DIAGNOSIS — R29818 Other symptoms and signs involving the nervous system: Secondary | ICD-10-CM | POA: Diagnosis not present

## 2021-07-05 DIAGNOSIS — R0602 Shortness of breath: Secondary | ICD-10-CM | POA: Diagnosis not present

## 2021-07-05 DIAGNOSIS — R0789 Other chest pain: Secondary | ICD-10-CM | POA: Diagnosis not present

## 2021-07-05 DIAGNOSIS — Z885 Allergy status to narcotic agent status: Secondary | ICD-10-CM | POA: Diagnosis not present

## 2021-07-05 DIAGNOSIS — J984 Other disorders of lung: Secondary | ICD-10-CM | POA: Diagnosis not present

## 2021-07-05 DIAGNOSIS — R0781 Pleurodynia: Secondary | ICD-10-CM | POA: Diagnosis not present

## 2021-07-05 DIAGNOSIS — E785 Hyperlipidemia, unspecified: Secondary | ICD-10-CM | POA: Diagnosis not present

## 2021-08-08 DIAGNOSIS — M85852 Other specified disorders of bone density and structure, left thigh: Secondary | ICD-10-CM | POA: Diagnosis not present

## 2021-08-08 DIAGNOSIS — M8588 Other specified disorders of bone density and structure, other site: Secondary | ICD-10-CM | POA: Diagnosis not present

## 2021-08-08 DIAGNOSIS — Z13820 Encounter for screening for osteoporosis: Secondary | ICD-10-CM | POA: Diagnosis not present

## 2021-08-14 DIAGNOSIS — R419 Unspecified symptoms and signs involving cognitive functions and awareness: Secondary | ICD-10-CM | POA: Diagnosis not present

## 2021-08-15 DIAGNOSIS — R430 Anosmia: Secondary | ICD-10-CM | POA: Diagnosis not present

## 2021-08-15 DIAGNOSIS — M1990 Unspecified osteoarthritis, unspecified site: Secondary | ICD-10-CM | POA: Diagnosis not present

## 2021-08-15 DIAGNOSIS — Z0000 Encounter for general adult medical examination without abnormal findings: Secondary | ICD-10-CM | POA: Diagnosis not present

## 2021-08-15 DIAGNOSIS — E785 Hyperlipidemia, unspecified: Secondary | ICD-10-CM | POA: Diagnosis not present

## 2021-08-15 DIAGNOSIS — R432 Parageusia: Secondary | ICD-10-CM | POA: Diagnosis not present

## 2021-09-29 DIAGNOSIS — D225 Melanocytic nevi of trunk: Secondary | ICD-10-CM | POA: Diagnosis not present

## 2021-09-29 DIAGNOSIS — L821 Other seborrheic keratosis: Secondary | ICD-10-CM | POA: Diagnosis not present

## 2021-09-29 DIAGNOSIS — L814 Other melanin hyperpigmentation: Secondary | ICD-10-CM | POA: Diagnosis not present

## 2021-09-29 DIAGNOSIS — L578 Other skin changes due to chronic exposure to nonionizing radiation: Secondary | ICD-10-CM | POA: Diagnosis not present

## 2021-10-03 DIAGNOSIS — M545 Low back pain, unspecified: Secondary | ICD-10-CM | POA: Diagnosis not present

## 2021-10-24 DIAGNOSIS — S39012D Strain of muscle, fascia and tendon of lower back, subsequent encounter: Secondary | ICD-10-CM | POA: Diagnosis not present

## 2021-10-24 DIAGNOSIS — U099 Post covid-19 condition, unspecified: Secondary | ICD-10-CM | POA: Diagnosis not present

## 2021-12-06 DIAGNOSIS — E785 Hyperlipidemia, unspecified: Secondary | ICD-10-CM | POA: Diagnosis not present

## 2021-12-06 DIAGNOSIS — R7301 Impaired fasting glucose: Secondary | ICD-10-CM | POA: Diagnosis not present

## 2021-12-06 DIAGNOSIS — S39012D Strain of muscle, fascia and tendon of lower back, subsequent encounter: Secondary | ICD-10-CM | POA: Diagnosis not present

## 2021-12-06 DIAGNOSIS — R413 Other amnesia: Secondary | ICD-10-CM | POA: Diagnosis not present

## 2022-02-20 DIAGNOSIS — U071 COVID-19: Secondary | ICD-10-CM | POA: Diagnosis not present

## 2022-02-20 DIAGNOSIS — E860 Dehydration: Secondary | ICD-10-CM | POA: Diagnosis not present

## 2022-05-24 DIAGNOSIS — Z1231 Encounter for screening mammogram for malignant neoplasm of breast: Secondary | ICD-10-CM | POA: Diagnosis not present

## 2022-05-24 DIAGNOSIS — R92323 Mammographic fibroglandular density, bilateral breasts: Secondary | ICD-10-CM | POA: Diagnosis not present

## 2022-07-04 DIAGNOSIS — Z01419 Encounter for gynecological examination (general) (routine) without abnormal findings: Secondary | ICD-10-CM | POA: Diagnosis not present

## 2022-07-04 DIAGNOSIS — Z6823 Body mass index (BMI) 23.0-23.9, adult: Secondary | ICD-10-CM | POA: Diagnosis not present

## 2022-09-17 DIAGNOSIS — D225 Melanocytic nevi of trunk: Secondary | ICD-10-CM | POA: Diagnosis not present

## 2022-09-17 DIAGNOSIS — L814 Other melanin hyperpigmentation: Secondary | ICD-10-CM | POA: Diagnosis not present

## 2022-09-17 DIAGNOSIS — L578 Other skin changes due to chronic exposure to nonionizing radiation: Secondary | ICD-10-CM | POA: Diagnosis not present

## 2022-09-17 DIAGNOSIS — L821 Other seborrheic keratosis: Secondary | ICD-10-CM | POA: Diagnosis not present

## 2022-10-03 DIAGNOSIS — H524 Presbyopia: Secondary | ICD-10-CM | POA: Diagnosis not present

## 2022-12-20 ENCOUNTER — Ambulatory Visit (HOSPITAL_COMMUNITY): Payer: Self-pay

## 2022-12-20 DIAGNOSIS — M62830 Muscle spasm of back: Secondary | ICD-10-CM | POA: Diagnosis not present

## 2022-12-20 DIAGNOSIS — Z13220 Encounter for screening for lipoid disorders: Secondary | ICD-10-CM | POA: Diagnosis not present

## 2022-12-20 DIAGNOSIS — Z0000 Encounter for general adult medical examination without abnormal findings: Secondary | ICD-10-CM | POA: Diagnosis not present

## 2022-12-20 DIAGNOSIS — Z1329 Encounter for screening for other suspected endocrine disorder: Secondary | ICD-10-CM | POA: Diagnosis not present

## 2023-01-29 DIAGNOSIS — R0781 Pleurodynia: Secondary | ICD-10-CM | POA: Diagnosis not present

## 2023-01-29 DIAGNOSIS — M51369 Other intervertebral disc degeneration, lumbar region without mention of lumbar back pain or lower extremity pain: Secondary | ICD-10-CM | POA: Diagnosis not present

## 2023-01-29 DIAGNOSIS — M62830 Muscle spasm of back: Secondary | ICD-10-CM | POA: Diagnosis not present

## 2023-01-29 DIAGNOSIS — M47816 Spondylosis without myelopathy or radiculopathy, lumbar region: Secondary | ICD-10-CM | POA: Diagnosis not present

## 2023-01-29 DIAGNOSIS — M4186 Other forms of scoliosis, lumbar region: Secondary | ICD-10-CM | POA: Diagnosis not present

## 2023-01-29 DIAGNOSIS — M25561 Pain in right knee: Secondary | ICD-10-CM | POA: Diagnosis not present

## 2023-02-18 DIAGNOSIS — M2569 Stiffness of other specified joint, not elsewhere classified: Secondary | ICD-10-CM | POA: Diagnosis not present

## 2023-02-18 DIAGNOSIS — M5459 Other low back pain: Secondary | ICD-10-CM | POA: Diagnosis not present

## 2023-02-18 DIAGNOSIS — M7918 Myalgia, other site: Secondary | ICD-10-CM | POA: Diagnosis not present

## 2023-02-27 DIAGNOSIS — M7918 Myalgia, other site: Secondary | ICD-10-CM | POA: Diagnosis not present

## 2023-02-27 DIAGNOSIS — M5459 Other low back pain: Secondary | ICD-10-CM | POA: Diagnosis not present

## 2023-02-27 DIAGNOSIS — M2569 Stiffness of other specified joint, not elsewhere classified: Secondary | ICD-10-CM | POA: Diagnosis not present

## 2023-03-04 DIAGNOSIS — M7918 Myalgia, other site: Secondary | ICD-10-CM | POA: Diagnosis not present

## 2023-03-04 DIAGNOSIS — M2569 Stiffness of other specified joint, not elsewhere classified: Secondary | ICD-10-CM | POA: Diagnosis not present

## 2023-03-04 DIAGNOSIS — M5459 Other low back pain: Secondary | ICD-10-CM | POA: Diagnosis not present

## 2023-03-08 DIAGNOSIS — M7918 Myalgia, other site: Secondary | ICD-10-CM | POA: Diagnosis not present

## 2023-03-08 DIAGNOSIS — M5459 Other low back pain: Secondary | ICD-10-CM | POA: Diagnosis not present

## 2023-03-08 DIAGNOSIS — M2569 Stiffness of other specified joint, not elsewhere classified: Secondary | ICD-10-CM | POA: Diagnosis not present

## 2023-03-13 DIAGNOSIS — M2569 Stiffness of other specified joint, not elsewhere classified: Secondary | ICD-10-CM | POA: Diagnosis not present

## 2023-03-13 DIAGNOSIS — M5459 Other low back pain: Secondary | ICD-10-CM | POA: Diagnosis not present

## 2023-03-13 DIAGNOSIS — M7918 Myalgia, other site: Secondary | ICD-10-CM | POA: Diagnosis not present

## 2023-03-21 DIAGNOSIS — M7918 Myalgia, other site: Secondary | ICD-10-CM | POA: Diagnosis not present

## 2023-03-21 DIAGNOSIS — M5459 Other low back pain: Secondary | ICD-10-CM | POA: Diagnosis not present

## 2023-03-21 DIAGNOSIS — M2569 Stiffness of other specified joint, not elsewhere classified: Secondary | ICD-10-CM | POA: Diagnosis not present

## 2023-03-27 DIAGNOSIS — M2569 Stiffness of other specified joint, not elsewhere classified: Secondary | ICD-10-CM | POA: Diagnosis not present

## 2023-03-27 DIAGNOSIS — M5459 Other low back pain: Secondary | ICD-10-CM | POA: Diagnosis not present

## 2023-03-27 DIAGNOSIS — M7918 Myalgia, other site: Secondary | ICD-10-CM | POA: Diagnosis not present

## 2023-04-02 DIAGNOSIS — M791 Myalgia, unspecified site: Secondary | ICD-10-CM | POA: Diagnosis not present

## 2023-04-02 DIAGNOSIS — Z79899 Other long term (current) drug therapy: Secondary | ICD-10-CM | POA: Diagnosis not present

## 2023-04-02 DIAGNOSIS — R252 Cramp and spasm: Secondary | ICD-10-CM | POA: Diagnosis not present

## 2023-04-02 DIAGNOSIS — R768 Other specified abnormal immunological findings in serum: Secondary | ICD-10-CM | POA: Diagnosis not present

## 2023-04-03 DIAGNOSIS — M791 Myalgia, unspecified site: Secondary | ICD-10-CM | POA: Diagnosis not present

## 2023-04-03 DIAGNOSIS — R109 Unspecified abdominal pain: Secondary | ICD-10-CM | POA: Diagnosis not present

## 2023-04-03 DIAGNOSIS — R768 Other specified abnormal immunological findings in serum: Secondary | ICD-10-CM | POA: Diagnosis not present

## 2023-04-03 DIAGNOSIS — Z13220 Encounter for screening for lipoid disorders: Secondary | ICD-10-CM | POA: Diagnosis not present

## 2023-04-09 DIAGNOSIS — H18413 Arcus senilis, bilateral: Secondary | ICD-10-CM | POA: Diagnosis not present

## 2023-04-09 DIAGNOSIS — H04123 Dry eye syndrome of bilateral lacrimal glands: Secondary | ICD-10-CM | POA: Diagnosis not present

## 2023-04-09 DIAGNOSIS — H35363 Drusen (degenerative) of macula, bilateral: Secondary | ICD-10-CM | POA: Diagnosis not present

## 2023-04-09 DIAGNOSIS — H10812 Pingueculitis, left eye: Secondary | ICD-10-CM | POA: Diagnosis not present

## 2023-04-17 DIAGNOSIS — J0140 Acute pansinusitis, unspecified: Secondary | ICD-10-CM | POA: Diagnosis not present

## 2023-04-17 DIAGNOSIS — R509 Fever, unspecified: Secondary | ICD-10-CM | POA: Diagnosis not present

## 2023-04-23 DIAGNOSIS — R918 Other nonspecific abnormal finding of lung field: Secondary | ICD-10-CM | POA: Diagnosis not present

## 2023-04-23 DIAGNOSIS — R051 Acute cough: Secondary | ICD-10-CM | POA: Diagnosis not present

## 2023-04-24 DIAGNOSIS — R748 Abnormal levels of other serum enzymes: Secondary | ICD-10-CM | POA: Diagnosis not present

## 2023-04-24 DIAGNOSIS — R0602 Shortness of breath: Secondary | ICD-10-CM | POA: Diagnosis not present

## 2023-04-24 DIAGNOSIS — R1013 Epigastric pain: Secondary | ICD-10-CM | POA: Diagnosis not present

## 2023-04-24 DIAGNOSIS — R1030 Lower abdominal pain, unspecified: Secondary | ICD-10-CM | POA: Diagnosis not present

## 2023-04-24 DIAGNOSIS — R112 Nausea with vomiting, unspecified: Secondary | ICD-10-CM | POA: Diagnosis not present

## 2023-04-24 DIAGNOSIS — E785 Hyperlipidemia, unspecified: Secondary | ICD-10-CM | POA: Diagnosis not present

## 2023-04-24 DIAGNOSIS — Z8709 Personal history of other diseases of the respiratory system: Secondary | ICD-10-CM | POA: Diagnosis not present

## 2023-04-24 DIAGNOSIS — K8500 Idiopathic acute pancreatitis without necrosis or infection: Secondary | ICD-10-CM | POA: Diagnosis not present

## 2023-04-24 DIAGNOSIS — K760 Fatty (change of) liver, not elsewhere classified: Secondary | ICD-10-CM | POA: Diagnosis not present

## 2023-04-24 DIAGNOSIS — J9811 Atelectasis: Secondary | ICD-10-CM | POA: Diagnosis not present

## 2023-04-24 DIAGNOSIS — R1011 Right upper quadrant pain: Secondary | ICD-10-CM | POA: Diagnosis not present

## 2023-04-24 DIAGNOSIS — R9431 Abnormal electrocardiogram [ECG] [EKG]: Secondary | ICD-10-CM | POA: Diagnosis not present

## 2023-04-24 DIAGNOSIS — R0600 Dyspnea, unspecified: Secondary | ICD-10-CM | POA: Diagnosis not present

## 2023-04-24 DIAGNOSIS — Z79899 Other long term (current) drug therapy: Secondary | ICD-10-CM | POA: Diagnosis not present

## 2023-04-24 DIAGNOSIS — K529 Noninfective gastroenteritis and colitis, unspecified: Secondary | ICD-10-CM | POA: Diagnosis not present

## 2023-04-24 DIAGNOSIS — N3289 Other specified disorders of bladder: Secondary | ICD-10-CM | POA: Diagnosis not present

## 2023-04-24 DIAGNOSIS — R918 Other nonspecific abnormal finding of lung field: Secondary | ICD-10-CM | POA: Diagnosis not present

## 2023-04-26 DIAGNOSIS — J1569 Pneumonia due to other gram-negative bacteria: Secondary | ICD-10-CM | POA: Diagnosis not present

## 2023-04-29 DIAGNOSIS — J1569 Pneumonia due to other gram-negative bacteria: Secondary | ICD-10-CM | POA: Diagnosis not present

## 2023-04-29 DIAGNOSIS — R768 Other specified abnormal immunological findings in serum: Secondary | ICD-10-CM | POA: Diagnosis not present

## 2023-04-29 DIAGNOSIS — M791 Myalgia, unspecified site: Secondary | ICD-10-CM | POA: Diagnosis not present

## 2023-04-29 DIAGNOSIS — R252 Cramp and spasm: Secondary | ICD-10-CM | POA: Diagnosis not present

## 2023-04-29 DIAGNOSIS — Z79899 Other long term (current) drug therapy: Secondary | ICD-10-CM | POA: Diagnosis not present

## 2023-05-23 DIAGNOSIS — R748 Abnormal levels of other serum enzymes: Secondary | ICD-10-CM | POA: Diagnosis not present

## 2023-07-04 DIAGNOSIS — E782 Mixed hyperlipidemia: Secondary | ICD-10-CM | POA: Diagnosis not present

## 2023-07-04 DIAGNOSIS — J0140 Acute pansinusitis, unspecified: Secondary | ICD-10-CM | POA: Diagnosis not present

## 2023-07-06 DIAGNOSIS — M79661 Pain in right lower leg: Secondary | ICD-10-CM | POA: Diagnosis not present

## 2023-07-06 DIAGNOSIS — I609 Nontraumatic subarachnoid hemorrhage, unspecified: Secondary | ICD-10-CM | POA: Diagnosis not present

## 2023-07-06 DIAGNOSIS — S06320A Contusion and laceration of left cerebrum without loss of consciousness, initial encounter: Secondary | ICD-10-CM | POA: Diagnosis not present

## 2023-07-06 DIAGNOSIS — R29818 Other symptoms and signs involving the nervous system: Secondary | ICD-10-CM | POA: Diagnosis not present

## 2023-07-06 DIAGNOSIS — G4089 Other seizures: Secondary | ICD-10-CM | POA: Diagnosis not present

## 2023-07-06 DIAGNOSIS — R29702 NIHSS score 2: Secondary | ICD-10-CM | POA: Diagnosis not present

## 2023-07-06 DIAGNOSIS — M6281 Muscle weakness (generalized): Secondary | ICD-10-CM | POA: Diagnosis not present

## 2023-07-06 DIAGNOSIS — I611 Nontraumatic intracerebral hemorrhage in hemisphere, cortical: Secondary | ICD-10-CM | POA: Diagnosis not present

## 2023-07-06 DIAGNOSIS — I680 Cerebral amyloid angiopathy: Secondary | ICD-10-CM | POA: Diagnosis not present

## 2023-07-06 DIAGNOSIS — G43909 Migraine, unspecified, not intractable, without status migrainosus: Secondary | ICD-10-CM | POA: Diagnosis not present

## 2023-07-06 DIAGNOSIS — Z8616 Personal history of COVID-19: Secondary | ICD-10-CM | POA: Diagnosis not present

## 2023-07-06 DIAGNOSIS — Z87898 Personal history of other specified conditions: Secondary | ICD-10-CM | POA: Diagnosis not present

## 2023-07-06 DIAGNOSIS — R29898 Other symptoms and signs involving the musculoskeletal system: Secondary | ICD-10-CM | POA: Diagnosis not present

## 2023-07-06 DIAGNOSIS — R9089 Other abnormal findings on diagnostic imaging of central nervous system: Secondary | ICD-10-CM | POA: Diagnosis not present

## 2023-07-06 DIAGNOSIS — I629 Nontraumatic intracranial hemorrhage, unspecified: Secondary | ICD-10-CM | POA: Diagnosis not present

## 2023-07-06 DIAGNOSIS — G936 Cerebral edema: Secondary | ICD-10-CM | POA: Diagnosis not present

## 2023-07-06 DIAGNOSIS — I618 Other nontraumatic intracerebral hemorrhage: Secondary | ICD-10-CM | POA: Diagnosis not present

## 2023-07-06 DIAGNOSIS — M25551 Pain in right hip: Secondary | ICD-10-CM | POA: Diagnosis not present

## 2023-08-07 DIAGNOSIS — Z1331 Encounter for screening for depression: Secondary | ICD-10-CM | POA: Diagnosis not present

## 2023-08-07 DIAGNOSIS — Z6822 Body mass index (BMI) 22.0-22.9, adult: Secondary | ICD-10-CM | POA: Diagnosis not present

## 2023-08-07 DIAGNOSIS — Z01419 Encounter for gynecological examination (general) (routine) without abnormal findings: Secondary | ICD-10-CM | POA: Diagnosis not present

## 2023-09-02 DIAGNOSIS — M62830 Muscle spasm of back: Secondary | ICD-10-CM | POA: Diagnosis not present

## 2023-09-13 DIAGNOSIS — R92333 Mammographic heterogeneous density, bilateral breasts: Secondary | ICD-10-CM | POA: Diagnosis not present

## 2023-09-13 DIAGNOSIS — M810 Age-related osteoporosis without current pathological fracture: Secondary | ICD-10-CM | POA: Diagnosis not present

## 2023-09-13 DIAGNOSIS — Z13820 Encounter for screening for osteoporosis: Secondary | ICD-10-CM | POA: Diagnosis not present

## 2023-09-13 DIAGNOSIS — Z1231 Encounter for screening mammogram for malignant neoplasm of breast: Secondary | ICD-10-CM | POA: Diagnosis not present

## 2023-09-13 DIAGNOSIS — Z780 Asymptomatic menopausal state: Secondary | ICD-10-CM | POA: Diagnosis not present

## 2023-09-24 DIAGNOSIS — E854 Organ-limited amyloidosis: Secondary | ICD-10-CM | POA: Diagnosis not present

## 2023-09-24 DIAGNOSIS — M47814 Spondylosis without myelopathy or radiculopathy, thoracic region: Secondary | ICD-10-CM | POA: Diagnosis not present

## 2023-09-24 DIAGNOSIS — R269 Unspecified abnormalities of gait and mobility: Secondary | ICD-10-CM | POA: Diagnosis not present

## 2023-09-24 DIAGNOSIS — I680 Cerebral amyloid angiopathy: Secondary | ICD-10-CM | POA: Diagnosis not present

## 2023-09-24 DIAGNOSIS — I6782 Cerebral ischemia: Secondary | ICD-10-CM | POA: Diagnosis not present

## 2023-09-27 DIAGNOSIS — L821 Other seborrheic keratosis: Secondary | ICD-10-CM | POA: Diagnosis not present

## 2023-09-27 DIAGNOSIS — L908 Other atrophic disorders of skin: Secondary | ICD-10-CM | POA: Diagnosis not present

## 2023-09-27 DIAGNOSIS — L814 Other melanin hyperpigmentation: Secondary | ICD-10-CM | POA: Diagnosis not present

## 2023-09-27 DIAGNOSIS — D225 Melanocytic nevi of trunk: Secondary | ICD-10-CM | POA: Diagnosis not present

## 2023-10-09 DIAGNOSIS — E782 Mixed hyperlipidemia: Secondary | ICD-10-CM | POA: Diagnosis not present

## 2023-10-15 DIAGNOSIS — M62830 Muscle spasm of back: Secondary | ICD-10-CM | POA: Diagnosis not present

## 2023-10-29 DIAGNOSIS — G8929 Other chronic pain: Secondary | ICD-10-CM | POA: Diagnosis not present

## 2023-10-30 DIAGNOSIS — H524 Presbyopia: Secondary | ICD-10-CM | POA: Diagnosis not present

## 2023-11-21 DIAGNOSIS — M47814 Spondylosis without myelopathy or radiculopathy, thoracic region: Secondary | ICD-10-CM | POA: Diagnosis not present

## 2023-11-21 DIAGNOSIS — M5414 Radiculopathy, thoracic region: Secondary | ICD-10-CM | POA: Diagnosis not present

## 2023-11-21 DIAGNOSIS — M5459 Other low back pain: Secondary | ICD-10-CM | POA: Diagnosis not present

## 2023-12-17 DIAGNOSIS — M5414 Radiculopathy, thoracic region: Secondary | ICD-10-CM | POA: Diagnosis not present

## 2023-12-20 DIAGNOSIS — H5111 Convergence insufficiency: Secondary | ICD-10-CM | POA: Diagnosis not present

## 2024-02-15 IMAGING — MR MR HEAD WO/W CM
12 series · 48 of 48 positions shown · IV contrast (gadavist)
Comparison: None.

CLINICAL DATA: Worsening memory loss.  COVID 8482.

EXAM:
MRI HEAD WITHOUT AND WITH CONTRAST
TECHNIQUE: Multiplanar, multiecho pulse sequences of the brain and surrounding
structures were obtained without and with intravenous contrast.
CONTRAST:  7mL GADAVIST GADOBUTROL 1 MMOL/ML IV SOLN

[Series 2: DWI · axial · 3.0mm · 1.20mm/px · z∈[-76,+85]mm · 8 of 110 slices shown (1 of 4)]
[im 1/110]
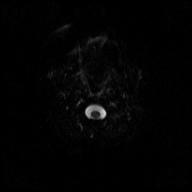
[im 16/110]
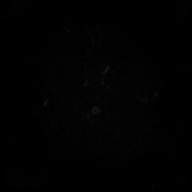
[im 32/110]
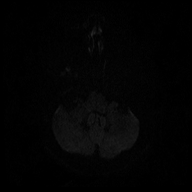
[im 47/110]
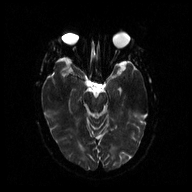
[im 63/110]
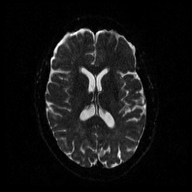
[im 78/110]
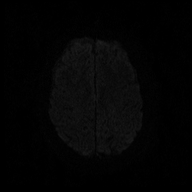
[im 94/110]
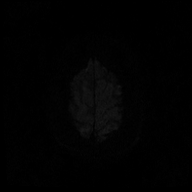
[im 110/110]
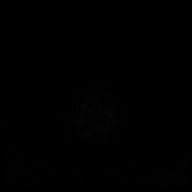

[Series 3: DWI · axial · 3.0mm · 1.20mm/px · z∈[-76,+85]mm · 4 of 54 slices shown (2 of 4)]
[im 1/54]
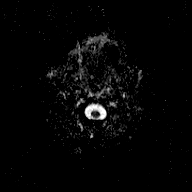
[im 18/54]
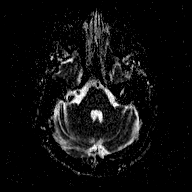
[im 36/54]
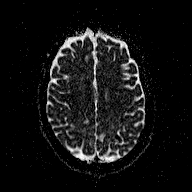
[im 54/54]
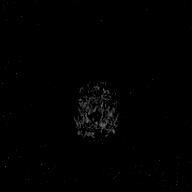

[Series 4: DWI · coronal · 5.0mm · 1.25mm/px · 4 of 61 slices shown (3 of 4)]
[im 1/61]
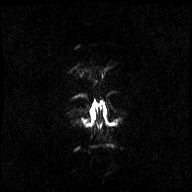
[im 21/61]
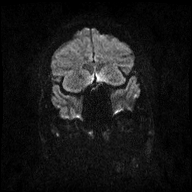
[im 41/61]
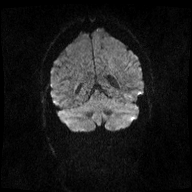
[im 61/61]
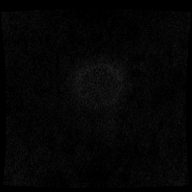

[Series 5: DWI · coronal · 5.0mm · 1.25mm/px · 2 of 31 slices shown (4 of 4)]
[im 1/31]
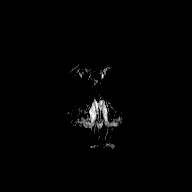
[im 31/31]
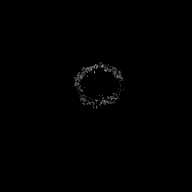

[Series 6: T1 · sagittal · 5.0mm · 0.45mm/px · 1 of 23 slices shown (1 of 2)]
[im 1/23]
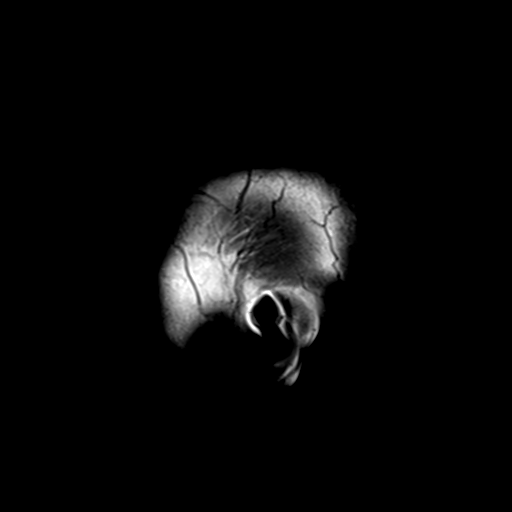

[Series 7: T2 · axial · 5.0mm · 0.72mm/px · 1 of 23 slices shown (1 of 2)]
[im 1/23]
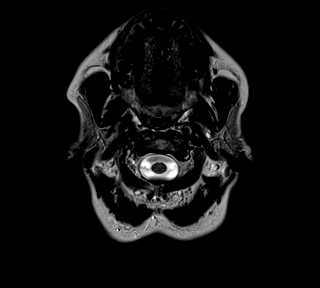

[Series 8: FLAIR · axial · 3.0mm · 0.45mm/px · z∈[-70,+86]mm · 3 of 53 slices shown]
[im 1/53]
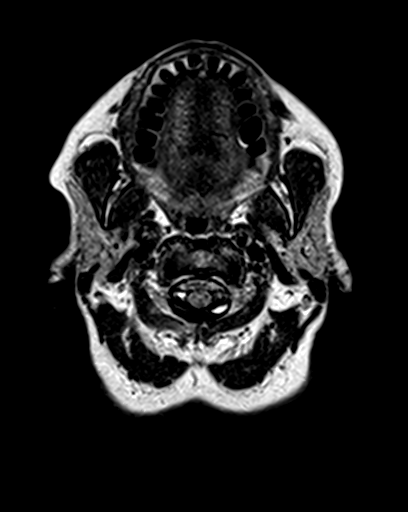
[im 27/53]
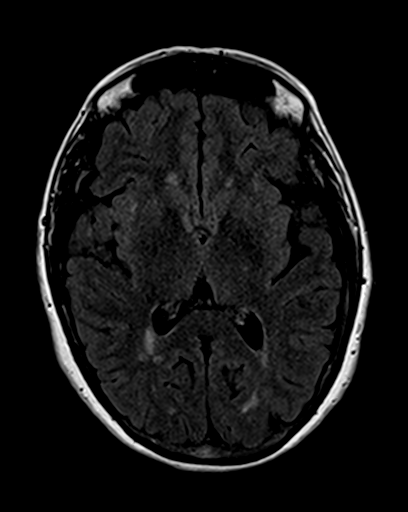
[im 53/53]
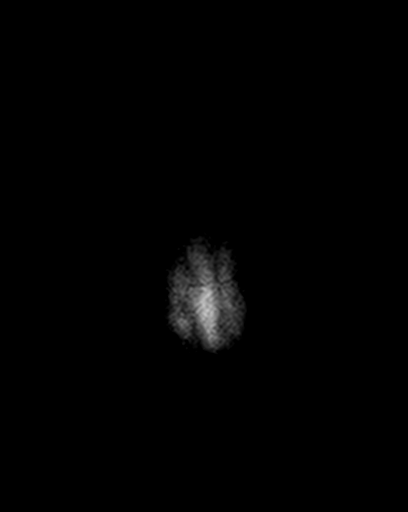

[Series 9: T2 · axial · 5.0mm · 0.72mm/px · 1 of 23 slices shown (2 of 2)]
[im 1/23]
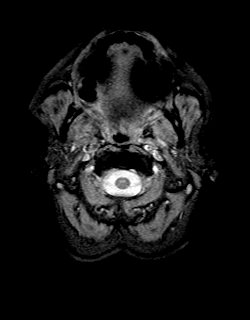

[Series 10: T1 · axial · 1.0mm · 0.94mm/px · z∈[-71,+88]mm · 10 of 160 slices shown (2 of 2)]
[im 1/160]
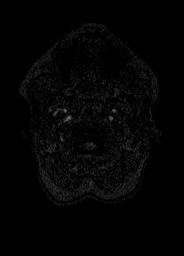
[im 18/160]
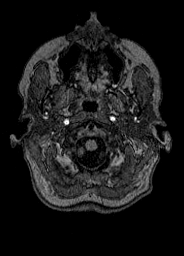
[im 36/160]
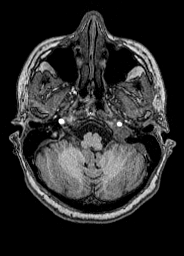
[im 54/160]
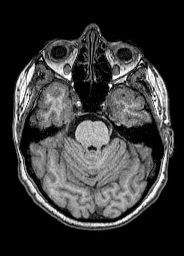
[im 71/160]
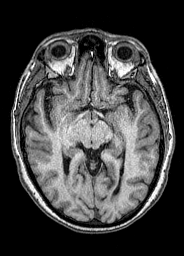
[im 89/160]
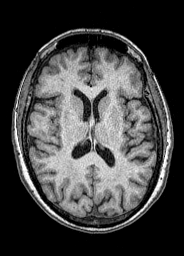
[im 107/160]
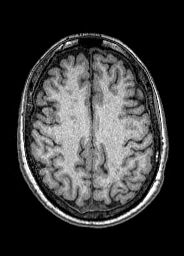
[im 124/160]
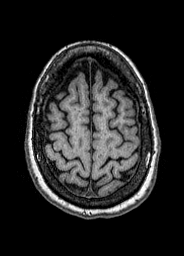
[im 142/160]
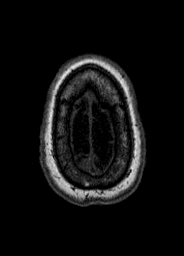
[im 160/160]
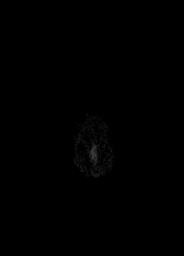

[Series 11: T2 post-contrast · coronal · 5.0mm · 0.43mm/px · 2 of 31 slices shown]
[im 1/31]
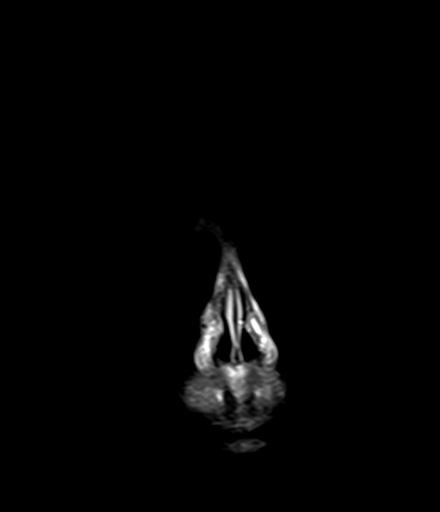
[im 31/31]
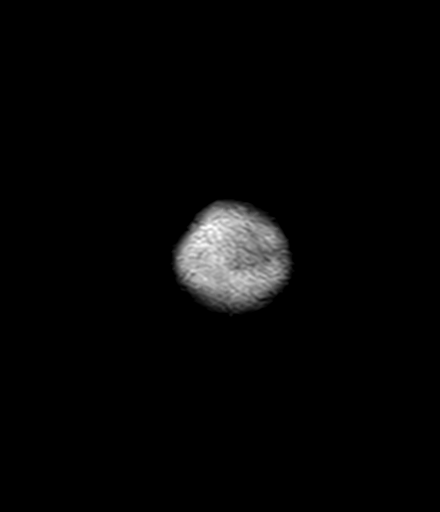

[Series 12: T1 post-contrast · axial · 1.0mm · 0.94mm/px · z∈[-71,+86]mm · 10 of 157 slices shown (1 of 2)]
[im 1/157]
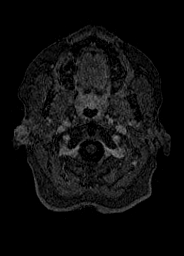
[im 18/157]
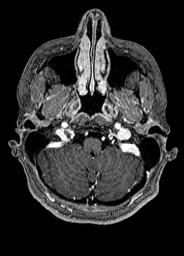
[im 35/157]
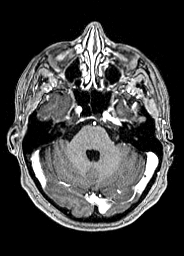
[im 53/157]
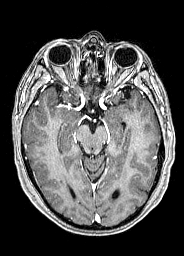
[im 70/157]
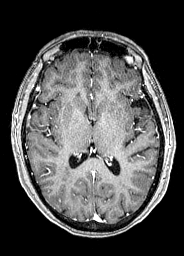
[im 87/157]
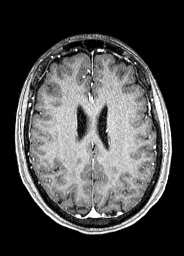
[im 105/157]
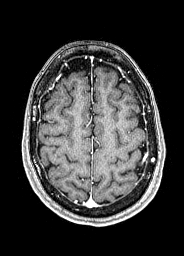
[im 122/157]
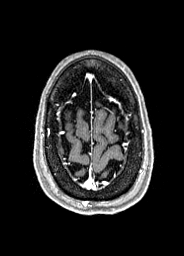
[im 139/157]
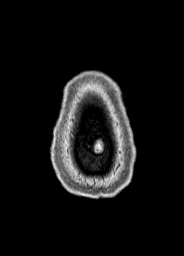
[im 157/157]
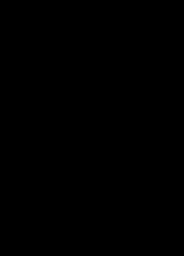

[Series 13: T1 post-contrast · coronal · 5.0mm · 0.43mm/px · 2 of 29 slices shown (2 of 2)]
[im 1/29]
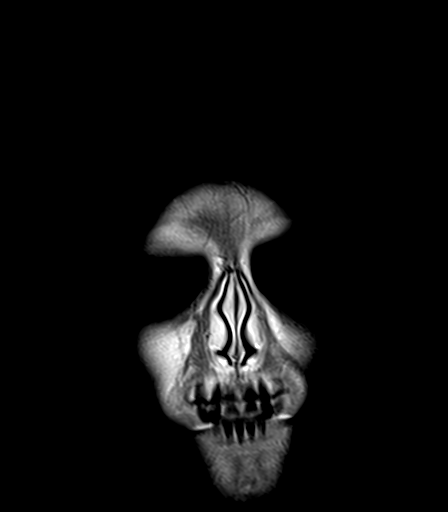
[im 29/29]
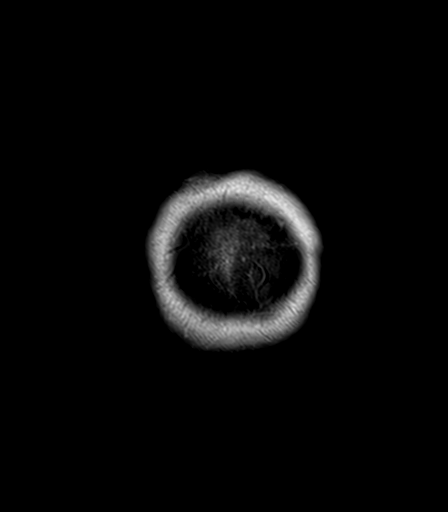

[48 of 48 positions shown; findings below may reference images not displayed]

FINDINGS: Brain: Ventricle size and cerebral volume normal. Negative for acute
infarct or mass. Normal enhancement

Mild white matter changes with scattered small white matter
hyperintensities bilaterally. Multiple foci of chronic
microhemorrhage. This is most prominent in the occipital lobe but
also extends into the cerebellum on the right.

Vascular: Normal arterial flow voids at the skull base.

Skull and upper cervical spine: Negative

Sinuses/Orbits: Negative

Other: None
IMPRESSION: No acute abnormality

Mild white matter changes most likely chronic microvascular
ischemia. Multiple areas of chronic microhemorrhage mostly in the
occipital lobes. Correlate with hypertension history. Also consider
early cerebral amyloid
